# Patient Record
Sex: Male | Born: 1985 | Race: White | Hispanic: No | Marital: Married | Smoking: Never smoker
Health system: Southern US, Community
[De-identification: ages and names within clinical notes are randomized; demographics above are authoritative.]

## PROBLEM LIST (undated history)

## (undated) DIAGNOSIS — T7840XA Allergy, unspecified, initial encounter: Secondary | ICD-10-CM

## (undated) HISTORY — DX: Allergy, unspecified, initial encounter: T78.40XA

---

## 2007-09-28 ENCOUNTER — Emergency Department (HOSPITAL_COMMUNITY): Admission: EM | Admit: 2007-09-28 | Discharge: 2007-09-29 | Payer: Self-pay | Admitting: Emergency Medicine

## 2008-04-10 ENCOUNTER — Emergency Department (HOSPITAL_COMMUNITY): Admission: EM | Admit: 2008-04-10 | Discharge: 2008-04-11 | Payer: Self-pay | Admitting: Emergency Medicine

## 2011-05-21 LAB — CBC
MCHC: 34.3
Platelets: 204
RDW: 14.7

## 2011-05-21 LAB — RAPID URINE DRUG SCREEN, HOSP PERFORMED
Amphetamines: NOT DETECTED
Benzodiazepines: NOT DETECTED
Cocaine: NOT DETECTED
Tetrahydrocannabinol: POSITIVE — AB

## 2011-05-21 LAB — BASIC METABOLIC PANEL
BUN: 9
Calcium: 9.2
Creatinine, Ser: 0.98
GFR calc non Af Amer: >60
Potassium: 3.2 — ABNORMAL LOW

## 2011-05-21 LAB — DIFFERENTIAL
Basophils Absolute: 0
Basophils Relative: 0
Lymphocytes Relative: 15
Neutro Abs: 13.5 — ABNORMAL HIGH

## 2011-05-21 LAB — URINALYSIS, ROUTINE W REFLEX MICROSCOPIC
Ketones, ur: NEGATIVE
Leukocytes, UA: NEGATIVE
Nitrite: NEGATIVE
Urobilinogen, UA: 0.2
pH: 6

## 2011-05-21 LAB — URINE MICROSCOPIC-ADD ON

## 2011-05-29 LAB — URINALYSIS, ROUTINE W REFLEX MICROSCOPIC
Glucose, UA: NEGATIVE
Leukocytes, UA: NEGATIVE
Nitrite: NEGATIVE
Specific Gravity, Urine: 1.037 — ABNORMAL HIGH
pH: 6

## 2011-05-29 LAB — BASIC METABOLIC PANEL
Chloride: 108
Creatinine, Ser: 1.17
GFR calc non Af Amer: >60
Potassium: 3 — ABNORMAL LOW

## 2011-05-29 LAB — RAPID URINE DRUG SCREEN, HOSP PERFORMED
Amphetamines: NOT DETECTED
Opiates: POSITIVE — AB

## 2011-05-29 LAB — ETHANOL

## 2011-05-29 LAB — CBC
HCT: 46
Hemoglobin: 15.5
Platelets: 223
RDW: 13.6
WBC: 9.7

## 2011-05-29 LAB — DIFFERENTIAL
Basophils Absolute: 0.1
Eosinophils Relative: 1
Lymphocytes Relative: 35
Lymphs Abs: 3.4
Neutro Abs: 5.6

## 2011-05-29 LAB — URINE MICROSCOPIC-ADD ON

## 2011-12-10 ENCOUNTER — Encounter: Payer: Self-pay | Admitting: Family Medicine

## 2011-12-10 ENCOUNTER — Ambulatory Visit (INDEPENDENT_AMBULATORY_CARE_PROVIDER_SITE_OTHER): Payer: BC Managed Care – PPO | Admitting: Family Medicine

## 2011-12-10 VITALS — BP 118/76 | HR 92 | Temp 98.6°F | Resp 16 | Ht 68.5 in | Wt 169.8 lb

## 2011-12-10 DIAGNOSIS — F17200 Nicotine dependence, unspecified, uncomplicated: Secondary | ICD-10-CM

## 2011-12-10 DIAGNOSIS — R0683 Snoring: Secondary | ICD-10-CM | POA: Insufficient documentation

## 2011-12-10 DIAGNOSIS — R0989 Other specified symptoms and signs involving the circulatory and respiratory systems: Secondary | ICD-10-CM

## 2011-12-10 DIAGNOSIS — F1911 Other psychoactive substance abuse, in remission: Secondary | ICD-10-CM | POA: Insufficient documentation

## 2011-12-10 MED ORDER — VARENICLINE TARTRATE 0.5 MG PO TABS: 0.5000 mg | ORAL_TABLET | Freq: Two times a day (BID) | ORAL | Status: AC

## 2011-12-10 MED ORDER — VARENICLINE TARTRATE 1 MG PO TABS: 1.0000 mg | ORAL_TABLET | Freq: Two times a day (BID) | ORAL | Status: AC

## 2011-12-10 NOTE — Progress Notes (Signed)
  Subjective:    Patient ID: Randy Barrett, male    DOB: August 17, 1986, 26 y.o.   MRN: [id number]  HPI  This 26 y.o. Cauc male is a smoker and had successful cessation with Chantix for 8 months   in 2011. He requests this medication again as he feels he can quit for good this time. He also snores  and is concerned that he may have sleep apnea. His father has this disorder and uses CPAP. The pt  has occasional fatigue but denies daytime sleepiness. His girlfriend has not reported apneic episodes   to the pt. He works full-time and exercises daily. He is in recovery and does not want to use any type of  hypnotics or other sleep aids. He reports some nights where he sleeps well but usually awakens   several times.     Review of Systems  Constitutional: Negative.   HENT:       Minor allergic symptoms but no daily medication required  Eyes: Negative.   Respiratory: Negative.   Cardiovascular: Negative.   Psychiatric/Behavioral: Positive for sleep disturbance. Negative for behavioral problems, confusion, dysphoric mood and agitation. The patient is not nervous/anxious and is not hyperactive.   All other systems reviewed and are negative.       Objective:   Physical Exam  Vitals reviewed. Constitutional: He is oriented to person, place, and time. He appears well-developed and well-nourished. No distress.  HENT:  Head: Normocephalic and atraumatic.  Right Ear: External ear normal.  Left Ear: External ear normal.  Nose: Nose normal.  Mouth/Throat: Oropharynx is clear and moist.  Eyes: Conjunctivae and EOM are normal. No scleral icterus.  Neck: Normal range of motion. Neck supple. No thyromegaly present.  Cardiovascular: Normal rate and regular rhythm.   Pulmonary/Chest: Effort normal. No respiratory distress.  Lymphadenopathy:    He has no cervical adenopathy.  Neurological: He is alert and oriented to person, place, and time. No cranial nerve deficit.  Psychiatric: He has a normal  mood and affect. His behavior is normal. Thought content normal.          Assessment & Plan:   1. Snoring  Nocturnal polysomnography (NPSG) Discussed simple strategies to prevent snoring  2. Tobacco dependence  RX: Chantix starter kit and maintenance kit x 2 months  3. Hx of substance abuse  Endorsed sobriety and healthy living

## 2011-12-10 NOTE — Patient Instructions (Signed)
Sleep Apnea Sleep apnea is a common disorder. The main problem of this disorder is excessive daytime sleepiness and compromised quality of life. This may include social and emotional problems. There are two types of sleep apnea.  Obstructive sleep apnea is when breathing stops due to a blocked airway.   Central sleep apnea is a malfunction of the brain's normal signal to breathe.  SYMPTOMS  Restless sleep.   Falling asleep while driving and/or during the day.   Loss of energy.   Irritability.   Mood or behavior changes.   Loud, heavy snoring.   Morning headaches.   Trouble concentrating.   Forgetfulness.   Anxiety or depression.   Decreased interest in sex.  Not all people with sleep apnea have all of these symptoms. However, people who have a few of these symptoms should visit their caregiver for an evaluation. Problems related to untreated sleep apnea include:  High blood pressure (hypertension).   Coronary artery disease.   Impotence.   Cognitive dysfunction.   Memory loss.  TREATMENT  For mild cases, treatment may include avoiding sleeping on one's back.   For people with nasal congestion, a decongestant may be prescribed.   Patients with obstructive and central apnea should avoid depressants. This includes alcohol, sedatives and narcotics. Weight loss and diet control are encouraged for overweight patients.   Many serious cases of obstructive sleep apnea can be relieved by a treatment called nasal continuous positive airway pressure (nasal CPAP). Nasal CPAP uses a mask-like device and pump that work together to keep the airway open. The pump delivers air pressure during each breath.   Surgery may help some patients by stopping or reducing the narrowing of the airway due to anatomical defects.  PROGNOSIS  Removing the obstruction usually reverses hypertension and cardiac problems. Untreated, sleep apnea sufferers have a tendency to fall asleep during the day.  This is can result in serious accident or loss of ones job. RESEARCH Sleep apnea is currently one of the most active areas of sleep research.  Document Released: 08/07/2002 Document Revised: 08/06/2011 Document Reviewed: 12/03/2005 [employer] Patient Information 2012 [employer]. 

## 2017-11-10 ENCOUNTER — Ambulatory Visit: Payer: No Typology Code available for payment source | Admitting: Family Medicine

## 2017-11-10 ENCOUNTER — Encounter: Payer: Self-pay | Admitting: Family Medicine

## 2017-11-10 VITALS — BP 130/84 | HR 80 | Temp 98.5°F | Resp 12 | Ht 68.5 in | Wt 183.5 lb

## 2017-11-10 DIAGNOSIS — R0683 Snoring: Secondary | ICD-10-CM

## 2017-11-10 DIAGNOSIS — J309 Allergic rhinitis, unspecified: Secondary | ICD-10-CM | POA: Diagnosis not present

## 2017-11-10 MED ORDER — FLUTICASONE PROPIONATE 50 MCG/ACT NA SUSP: 2.0000 | Freq: Every day | NASAL | 6 refills | Status: DC

## 2017-11-10 NOTE — Patient Instructions (Signed)
EPIC  down. 

## 2017-11-10 NOTE — Progress Notes (Signed)
HPI:   [name] is a 32 y.o. male, who is here today to establish care.  Former PCP: Dr Via Last preventive routine visit: 10/2016  Chronic medical problems: Allergic rhinitis, otherwise healthy. Past Hx of illicit drug use, last used 9-10 years ago.   Concerns today: He would like to have a sleep study. Reporting Hx of snoring disorder for years but it seems to be worse for the past 1-2 months.  In the past snoring was worse when lying on his back and better if he slept on his side or over his abdomen.  For the past couple of months snoring is "bad" regardless of position. According to patient, his wife has been complaining because his snoring is interfering with her sleep.  He denies morning headaches.  He has some fatigue,he feels like he should have more energy but attributes fatigue to his physical job and intense exercise.  He has used nasal strips before and snoring seemed to improve.  Allergic rhinitis, he is currently on Allegra 180 mg daily. He still has intermittent nasal congestion and rhinorrhea, depending off with the changes.  Review of Systems  Constitutional: Positive for fatigue. Negative for activity change, appetite change, fever and unexpected weight change.  HENT: Positive for congestion, postnasal drip and rhinorrhea. Negative for dental problem, nosebleeds and sore throat.   Respiratory: Negative for apnea, cough, shortness of breath and wheezing.   Cardiovascular: Negative for chest pain, palpitations and leg swelling.  Gastrointestinal: Negative for abdominal pain, nausea and vomiting.  Endocrine: Negative for cold intolerance and heat intolerance.  Genitourinary: Negative for decreased urine volume and hematuria.  Musculoskeletal: Negative for gait problem and myalgias.  Allergic/Immunologic: Positive for environmental allergies.  Neurological: Negative for syncope, weakness and headaches.      Current Outpatient Medications on  File Prior to Visit  Medication Sig Dispense Refill  . Fexofenadine HCl (ALLEGRA PO) Take by mouth as needed.    . Multiple Vitamin (MULTIVITAMIN) tablet Take 1 tablet by mouth daily.     No current facility-administered medications on file prior to visit.      Past Medical History:  Diagnosis Date  . Allergy    SEASONAL   No Known Allergies  Family History  Problem Relation Age of Onset  . Sleep apnea Father   . Cancer Maternal Grandfather        Lung cancer  . Diabetes Maternal Grandmother   . Diabetes Paternal Grandmother   . Heart attack Paternal Grandmother   . Mental illness Brother        Depression and suicide attempt as a teen  . Drug abuse Brother     Social History   Socioeconomic History  . Marital status: Married    Spouse name: None  . Number of children: None  . Years of education: None  . Highest education level: None  Social Needs  . Financial resource strain: None  . Food insecurity - worry: None  . Food insecurity - inability: None  . Transportation needs - medical: None  . Transportation needs - non-medical: None  Occupational History  . None  Tobacco Use  . Smoking status: Never Smoker  . Smokeless tobacco: Never Used  Substance and Sexual Activity  . Alcohol use: No  . Drug use: No  . Sexual activity: Yes    Partners: Female  Other Topics Concern  . None  Social History Narrative  . None    Vitals:  11/10/17 1528  BP: 130/84  Pulse: 80  Resp: 12  Temp: 98.5 F (36.9 C)  SpO2: 97%    Body mass index is 27.5 kg/m.      Physical Exam  Nursing note and vitals reviewed. Constitutional: He is oriented to person, place, and time. He appears well-developed and well-nourished. No distress.  HENT:  Head: Normocephalic and atraumatic.  Nose: Septal deviation present.  Mouth/Throat: Oropharynx is clear and moist and mucous membranes are normal.  Mildly hypertrophic turbinates.  Eyes: Conjunctivae are normal. Pupils are  equal, round, and reactive to light.  Neck: No tracheal deviation present. No thyroid mass and no thyromegaly present.  Cardiovascular: Normal rate and regular rhythm.  No murmur heard. Respiratory: Effort normal and breath sounds normal. No respiratory distress.  GI: Soft. He exhibits no mass. There is no hepatomegaly. There is no tenderness.  Musculoskeletal: He exhibits no edema.  Lymphadenopathy:    He has no cervical adenopathy.  Neurological: He is alert and oriented to person, place, and time. He has normal strength. Gait normal.  Skin: Skin is warm. No rash noted. No erythema.  Psychiatric: He has a normal mood and affect.  Well groomed, good eye contact.    ASSESSMENT AND PLAN:   [name] was seen today for establish care.   Snoring Explained that the snoring is not always pathologic, it could be aggravated by nasal congestion, weight gain, or position during his sleep. I recommend trying OTC nasal strips and intranasal Flonase at bedtime.  She is going to see his dentist on 3/26, he is planning on asking him about recommendations in regard to dental guard.  She will let me know if he is still interested in having sleep study.  ENT evaluation could also be considered, septal deviation could also be contributing to problem.  Allergic rhinitis This problem could aggravate his snoring. Flonase nasal spray at bedtime recommended. OTC antihistaminic to continue, he is currently on Allegra 180 mg. Nasal irrigation with saline may also help.       Randy Santucci G. [place], MD  [facility]. [place] office.

## 2017-11-10 NOTE — Assessment & Plan Note (Addendum)
Explained that the snoring is not always pathologic, it could be aggravated by nasal congestion, weight gain, or position during his sleep. I recommend trying OTC nasal strips and intranasal Flonase at bedtime.  She is going to see his dentist on 3/26, he is planning on asking him about recommendations in regard to dental guard.  She will let me know if he is still interested in having sleep study.  ENT evaluation could also be considered, septal deviation could also be contributing to problem.

## 2017-11-10 NOTE — Assessment & Plan Note (Signed)
This problem could aggravate his snoring. Flonase nasal spray at bedtime recommended. OTC antihistaminic to continue, he is currently on Allegra 180 mg. Nasal irrigation with saline may also help.

## 2017-11-19 ENCOUNTER — Encounter: Payer: Self-pay | Admitting: Family Medicine

## 2017-12-01 ENCOUNTER — Telehealth: Payer: Self-pay | Admitting: Family Medicine

## 2017-12-01 ENCOUNTER — Other Ambulatory Visit: Payer: Self-pay | Admitting: Family Medicine

## 2017-12-01 MED ORDER — MONTELUKAST SODIUM 10 MG PO TABS: 10.0000 mg | ORAL_TABLET | Freq: Every day | ORAL | 2 refills | Status: DC

## 2017-12-01 NOTE — Telephone Encounter (Signed)
Message sent to Dr. Jordan for review and approval. 

## 2017-12-01 NOTE — Telephone Encounter (Signed)
Copied from CRM [phone]. Topic: Quick Communication - Rx Refill/Question >> Dec 01, 2017  8:18 AM Landry MellowFoltz, Melissa J wrote: Medication: Singulair  Has the patient contacted their pharmacy? No. (Agent: If no, request that the patient contact the pharmacy for the refill.) Preferred Pharmacy (with phone number or street name): [facility] [address]  Agent: Please be advised that RX refills may take up to 3 business days. We ask that you follow-up with your pharmacy. Pt was told to call if Flonase didn't work and she would send in Singulair.

## 2017-12-01 NOTE — Telephone Encounter (Signed)
Pt RX Flonase on 11/10/17 by Dr. Sw[place]Jordan, (snoring) Pt states he was instructed to call if Flonase didn't work and she would send in Singulair.  [facility] [address]

## 2017-12-01 NOTE — Telephone Encounter (Signed)
Rx for Singulair 10 mg sent to his pharmacy. Continue Flonase nasal spray, Allegra 180 mg (he could change to Zyrtec 10 mg), and saline nasal irrigations as needed. Thanks, BJ

## 2017-12-01 NOTE — Telephone Encounter (Signed)
Patient informed that Rx was sent to pharmacy. Patient verbalized understanding.

## 2017-12-01 NOTE — Telephone Encounter (Signed)
Copied from CRM [phone]. Topic: Referral - Request >> Dec 01, 2017  8:16 AM Landry MellowFoltz, Melissa J wrote: Reason for CRM: pt would like to be referred for a sleep study - please call [phone]

## 2017-12-03 ENCOUNTER — Other Ambulatory Visit: Payer: Self-pay | Admitting: Family Medicine

## 2017-12-03 DIAGNOSIS — R0683 Snoring: Secondary | ICD-10-CM

## 2017-12-03 NOTE — Telephone Encounter (Signed)
Referral to pulmonologist to discuss sleep study due to loud snoring was placed.  Thanks, BJ

## 2017-12-15 ENCOUNTER — Ambulatory Visit (INDEPENDENT_AMBULATORY_CARE_PROVIDER_SITE_OTHER): Payer: No Typology Code available for payment source | Admitting: Pulmonary Disease

## 2017-12-15 ENCOUNTER — Encounter: Payer: Self-pay | Admitting: Pulmonary Disease

## 2017-12-15 DIAGNOSIS — G4733 Obstructive sleep apnea (adult) (pediatric): Secondary | ICD-10-CM

## 2017-12-15 NOTE — Progress Notes (Signed)
Subjective:    Patient ID: Randy Barrett, male    DOB: 1986-01-08, 32 y.o.   MRN: [id number]  HPI  32 year old presents for evaluation of sleep disordered breathing. Loud snoring has been noted by his wife especially in his back.  She has not witnessed apneas.  He denies excessive daytime somnolence or fatigue.  His snoring has been ongoing for 4 years but has been worse during allergy season this year.  He uses Allegra every year but this time he is also had to use nasal Flonase and Singulair.  Epworth sleepiness score is 6. Bedtime is between 9 and 11 PM, sleep latency is about 20 minutes, he sleeps on his back with one pillow, reports 1-2 nocturnal awakenings, denies nocturia and is out of bed by 6 AM feeling rested without dryness of mouth or headaches. He has gained about 10 pounds in the last 2 years most of which is muscle weight. He works as a Art therapist and drives long distances and does not have any difficulty driving.  There is no history suggestive of cataplexy, sleep paralysis or parasomnias   Past Medical History:  Diagnosis Date  . Allergy    SEASONAL   History reviewed. No pertinent surgical history.  No Known Allergies   Social History   Socioeconomic History  . Marital status: Married    Spouse name: Not on file  . Number of children: Not on file  . Years of education: Not on file  . Highest education level: Not on file  Occupational History  . Not on file  Social Needs  . Financial resource strain: Not on file  . Food insecurity:    Worry: Not on file    Inability: Not on file  . Transportation needs:    Medical: Not on file    Non-medical: Not on file  Tobacco Use  . Smoking status: Never Smoker  . Smokeless tobacco: Never Used  Substance and Sexual Activity  . Alcohol use: No  . Drug use: No  . Sexual activity: Yes    Partners: Female  Lifestyle  . Physical activity:    Days per week: Not on file    Minutes per session: Not  on file  . Stress: Not on file  Relationships  . Social connections:    Talks on phone: Not on file    Gets together: Not on file    Attends religious service: Not on file    Active member of club or organization: Not on file    Attends meetings of clubs or organizations: Not on file    Relationship status: Not on file  . Intimate partner violence:    Fear of current or ex partner: Not on file    Emotionally abused: Not on file    Physically abused: Not on file    Forced sexual activity: Not on file  Other Topics Concern  . Not on file  Social History Narrative  . Not on file     Family History  Problem Relation Age of Onset  . Sleep apnea Father   . Cancer Maternal Grandfather        Lung cancer  . Diabetes Maternal Grandmother   . Diabetes Paternal Grandmother   . Heart attack Paternal Grandmother   . Mental illness Brother        Depression and suicide attempt as a teen  . Drug abuse Brother      Review of Systems  Constitutional: Negative for  fever and unexpected weight change.  HENT: Positive for congestion and sinus pressure. Negative for dental problem, ear pain, nosebleeds, postnasal drip, rhinorrhea, sneezing, sore throat and trouble swallowing.   Eyes: Negative for redness and itching.  Respiratory: Negative for cough, chest tightness, shortness of breath and wheezing.   Cardiovascular: Negative for palpitations and leg swelling.  Gastrointestinal: Negative for nausea and vomiting.  Genitourinary: Negative for dysuria.  Musculoskeletal: Negative for joint swelling.  Skin: Negative for rash.  Allergic/Immunologic: Negative.  Negative for environmental allergies, food allergies and immunocompromised state.  Neurological: Negative for headaches.  Hematological: Does not bruise/bleed easily.  Psychiatric/Behavioral: Negative for dysphoric mood. The patient is not nervous/anxious.        Objective:   Physical Exam  Gen. Pleasant, muscular, in no  distress ENT - class 2 airway, no post nasal drip Neck: No JVD, no thyromegaly, no carotid bruits Lungs: no use of accessory muscles, no dullness to percussion, decreased without rales or rhonchi  Cardiovascular: Rhythm regular, heart sounds  normal, no murmurs or gallops, no peripheral edema Musculoskeletal: No deformities, no cyanosis or clubbing , no tremors       Assessment & Plan:

## 2017-12-15 NOTE — Assessment & Plan Note (Signed)
Schedule home sleep study. We discussed treatment options for simple snoring and for obstructive sleep apnea  Given  narrow pharyngeal exam & loud snoring, obstructive sleep apnea is very likely & an overnight polysomnogram will be scheduled as a home study. The pathophysiology of obstructive sleep apnea , it's cardiovascular consequences & modes of treatment including CPAP were discused with the patient in detail & they evidenced understanding.

## 2017-12-15 NOTE — Patient Instructions (Signed)
Schedule home sleep study. We discussed treatment options for simple snoring and for obstructive sleep apnea

## 2018-01-04 DIAGNOSIS — G4730 Sleep apnea, unspecified: Secondary | ICD-10-CM | POA: Diagnosis not present

## 2018-01-06 ENCOUNTER — Other Ambulatory Visit: Payer: Self-pay | Admitting: *Deleted

## 2018-01-06 DIAGNOSIS — G4733 Obstructive sleep apnea (adult) (pediatric): Secondary | ICD-10-CM

## 2018-01-06 DIAGNOSIS — G4710 Hypersomnia, unspecified: Secondary | ICD-10-CM | POA: Diagnosis not present

## 2018-01-07 ENCOUNTER — Telehealth: Payer: Self-pay | Admitting: Pulmonary Disease

## 2018-01-07 NOTE — Telephone Encounter (Signed)
Spoke with patient. He is aware of results. He stated that RA mentioned something about a nasal device for snoring?   RA, do you remember this? Please advise. Thanks!

## 2018-01-07 NOTE — Telephone Encounter (Signed)
Per RA, HST did not show OSA. No further treatment needed.

## 2018-01-10 NOTE — Telephone Encounter (Signed)
Called and spoke with patient regarding Provent Rx per RA recommendations. Pt is wanting to think on this, and speak with his dentist to see if he recommends anything in addition. Pt advised he will call back at a later date. Nothing further needed at this time.

## 2018-01-10 NOTE — Telephone Encounter (Signed)
PROVENT - have him check this out & we can send Rx if he is interested

## 2018-03-14 ENCOUNTER — Telehealth: Payer: Self-pay | Admitting: Pulmonary Disease

## 2018-03-14 NOTE — Telephone Encounter (Signed)
Patient returning call - he can be reached at [phone]

## 2018-03-14 NOTE — Telephone Encounter (Signed)
Pt's sleep study has been faxed to [facility].  Called and spoke with Alcario Droughtrica letting her know this had been done. Erica expressed understanding. Nothing further needed.

## 2018-03-14 NOTE — Telephone Encounter (Signed)
Called patient unable to reach left message to give us a call back.

## 2018-03-14 NOTE — Telephone Encounter (Signed)
Called patient, unable to reach left message to give us a call back. 

## 2018-03-14 NOTE — Telephone Encounter (Signed)
Pt is returning call. CB is [phone].

## 2018-03-14 NOTE — Telephone Encounter (Signed)
Called patient, unable to reach left message to give us a call back. Per protocol will close encounter.  

## 2018-03-20 ENCOUNTER — Other Ambulatory Visit: Payer: Self-pay | Admitting: Family Medicine

## 2018-06-03 ENCOUNTER — Ambulatory Visit (INDEPENDENT_AMBULATORY_CARE_PROVIDER_SITE_OTHER): Payer: No Typology Code available for payment source | Admitting: Urology

## 2018-06-03 DIAGNOSIS — I861 Scrotal varices: Secondary | ICD-10-CM | POA: Diagnosis not present

## 2018-06-03 DIAGNOSIS — N4611 Organic oligospermia: Secondary | ICD-10-CM

## 2018-06-09 ENCOUNTER — Other Ambulatory Visit: Payer: Self-pay | Admitting: Urology

## 2018-06-09 DIAGNOSIS — N4611 Organic oligospermia: Secondary | ICD-10-CM

## 2018-06-16 ENCOUNTER — Other Ambulatory Visit: Payer: Self-pay | Admitting: Urology

## 2018-06-16 DIAGNOSIS — N4611 Organic oligospermia: Secondary | ICD-10-CM

## 2018-06-16 DIAGNOSIS — I861 Scrotal varices: Secondary | ICD-10-CM

## 2018-06-21 ENCOUNTER — Ambulatory Visit (HOSPITAL_COMMUNITY)
Admission: RE | Admit: 2018-06-21 | Discharge: 2018-06-21 | Disposition: A | Discharge status: Home / Self Care | Payer: No Typology Code available for payment source | Source: Ambulatory Visit | Attending: Urology | Admitting: Urology

## 2018-06-21 DIAGNOSIS — I861 Scrotal varices: Secondary | ICD-10-CM | POA: Insufficient documentation

## 2018-06-21 DIAGNOSIS — N4611 Organic oligospermia: Secondary | ICD-10-CM | POA: Diagnosis present

## 2018-08-22 ENCOUNTER — Encounter: Payer: Self-pay | Admitting: Family Medicine

## 2018-08-22 ENCOUNTER — Ambulatory Visit: Payer: No Typology Code available for payment source | Admitting: Family Medicine

## 2018-08-22 VITALS — BP 118/70 | HR 66 | Temp 98.0°F | Resp 12 | Ht 69.0 in | Wt 175.1 lb

## 2018-08-22 DIAGNOSIS — N4611 Organic oligospermia: Secondary | ICD-10-CM | POA: Diagnosis not present

## 2018-08-22 LAB — TESTOSTERONE: Testosterone: 356.82 ng/dL (ref 300.00–890.00)

## 2018-08-22 NOTE — Progress Notes (Signed)
ACUTE VISIT   HPI:  Chief Complaint  Patient presents with  . Discuss labs for referral from Dr. Annabell HowellsWrenn    Randy Barrett is a 32 y.o. male, who is here today requesting lab work done. For a couple years he and his wife have tried to conceive but have been unsuccessful. He does not have children.  He is following with urologist and according to pt,he was told to have labs done in a local laboratory but he has been able to arrange appt.  Negative for headache, visual changes, nipple discharge,decreased libido, or ED. Denies fatigue or depressed mood.     Review of Systems  Constitutional: Negative for fatigue and unexpected weight change.  Endocrine: Negative for cold intolerance and heat intolerance.  Genitourinary: Negative for decreased urine volume, dysuria, hematuria and testicular pain.  Musculoskeletal: Negative for arthralgias and myalgias.  Neurological: Negative for weakness and headaches.  Psychiatric/Behavioral: Negative for sleep disturbance. The patient is not nervous/anxious.       Current Outpatient Medications on File Prior to Visit  Medication Sig Dispense Refill  . Fexofenadine HCl (ALLEGRA PO) Take by mouth as needed.    . fluticasone (FLONASE) 50 MCG/ACT nasal spray Place 2 sprays into both nostrils at bedtime. 16 g 6  . montelukast (SINGULAIR) 10 MG tablet TAKE 1 TABLET BY MOUTH EVERYDAY AT BEDTIME 30 tablet 2  . Multiple Vitamin (MULTIVITAMIN) tablet Take 1 tablet by mouth daily.     No current facility-administered medications on file prior to visit.      Past Medical History:  Diagnosis Date  . Allergy    SEASONAL   No Known Allergies  Social History   Socioeconomic History  . Marital status: Married    Spouse name: Not on file  . Number of children: Not on file  . Years of education: Not on file  . Highest education level: Not on file  Occupational History  . Not on file  Social Needs  . Financial resource  strain: Not on file  . Food insecurity:    Worry: Not on file    Inability: Not on file  . Transportation needs:    Medical: Not on file    Non-medical: Not on file  Tobacco Use  . Smoking status: Never Smoker  . Smokeless tobacco: Never Used  Substance and Sexual Activity  . Alcohol use: No  . Drug use: No  . Sexual activity: Yes    Partners: Female  Lifestyle  . Physical activity:    Days per week: Not on file    Minutes per session: Not on file  . Stress: Not on file  Relationships  . Social connections:    Talks on phone: Not on file    Gets together: Not on file    Attends religious service: Not on file    Active member of club or organization: Not on file    Attends meetings of clubs or organizations: Not on file    Relationship status: Not on file  Other Topics Concern  . Not on file  Social History Narrative  . Not on file    Vitals:   08/22/18 0833  BP: 118/70  Pulse: 66  Resp: 12  Temp: 98 F (36.7 C)  SpO2: 98%   Body mass index is 25.86 kg/m.   Physical Exam  Nursing note and vitals reviewed. Constitutional: He is oriented to person, place, and time. He appears well-developed and well-nourished. No distress.  HENT:  Head: Normocephalic and atraumatic.  Mouth/Throat: Oropharynx is clear and moist and mucous membranes are normal.  Eyes: Conjunctivae are normal.  Neck: No tracheal deviation present. No thyromegaly present.  Cardiovascular: Normal rate and regular rhythm.  No murmur heard. Respiratory: Effort normal and breath sounds normal. No respiratory distress.  Musculoskeletal:        General: No edema.  Lymphadenopathy:    He has no cervical adenopathy.  Neurological: He is alert and oriented to person, place, and time. He has normal strength. Gait normal.  Skin: Skin is warm. No erythema.  Psychiatric: He has a normal mood and affect.  Well groomed, good eye contact.      ASSESSMENT AND PLAN:  Randy Barrett was seen today for discuss  labs for referral from dr. Annabell Howellswrenn.  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 He has already had some blood work done. Dr Annabell HowellsWrenn is requesting above labs, sll ordered today. Results will be faxed to Dr Annabell HowellsWrenn.      [name] G. [place], MD  [facility]. [place] office.

## 2018-08-22 NOTE — Patient Instructions (Signed)
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 If a new problem present, please set up appointment sooner than planned today.

## 2018-08-23 LAB — FSH/LH
FSH: 2.7 m[IU]/mL (ref 1.6–8.0)
LH: 3.8 m[IU]/mL (ref 1.5–9.3)

## 2018-09-16 ENCOUNTER — Ambulatory Visit (INDEPENDENT_AMBULATORY_CARE_PROVIDER_SITE_OTHER): Payer: No Typology Code available for payment source | Admitting: Urology

## 2018-09-16 DIAGNOSIS — I861 Scrotal varices: Secondary | ICD-10-CM

## 2018-09-16 DIAGNOSIS — N4611 Organic oligospermia: Secondary | ICD-10-CM | POA: Diagnosis not present

## 2018-10-04 ENCOUNTER — Ambulatory Visit: Payer: No Typology Code available for payment source | Admitting: Urology

## 2018-10-04 DIAGNOSIS — I861 Scrotal varices: Secondary | ICD-10-CM | POA: Diagnosis not present

## 2018-10-04 DIAGNOSIS — N4611 Organic oligospermia: Secondary | ICD-10-CM | POA: Diagnosis not present

## 2018-10-05 ENCOUNTER — Other Ambulatory Visit: Payer: Self-pay | Admitting: Urology

## 2018-10-27 ENCOUNTER — Other Ambulatory Visit (HOSPITAL_COMMUNITY): Payer: No Typology Code available for payment source

## 2018-11-03 ENCOUNTER — Ambulatory Visit: Admit: 2018-11-03 | Payer: No Typology Code available for payment source | Admitting: Urology

## 2018-11-03 SURGERY — EXCISION, VARICOCELE
Anesthesia: General | Laterality: Left

## 2018-11-30 ENCOUNTER — Encounter: Payer: Self-pay | Admitting: Family Medicine

## 2018-12-01 ENCOUNTER — Other Ambulatory Visit: Payer: Self-pay | Admitting: *Deleted

## 2018-12-01 DIAGNOSIS — J309 Allergic rhinitis, unspecified: Secondary | ICD-10-CM

## 2018-12-01 MED ORDER — FLUTICASONE PROPIONATE 50 MCG/ACT NA SUSP: 2.0000 | Freq: Every day | NASAL | 6 refills | Status: DC

## 2018-12-01 MED ORDER — MONTELUKAST SODIUM 10 MG PO TABS: ORAL_TABLET | ORAL | 2 refills | Status: DC

## 2018-12-05 ENCOUNTER — Other Ambulatory Visit: Payer: Self-pay | Admitting: Family Medicine

## 2018-12-05 DIAGNOSIS — J309 Allergic rhinitis, unspecified: Secondary | ICD-10-CM

## 2018-12-07 ENCOUNTER — Other Ambulatory Visit: Payer: Self-pay | Admitting: *Deleted

## 2018-12-07 MED ORDER — MONTELUKAST SODIUM 10 MG PO TABS: ORAL_TABLET | ORAL | 2 refills | Status: DC

## 2019-03-29 ENCOUNTER — Other Ambulatory Visit: Payer: Self-pay | Admitting: Family Medicine

## 2019-04-04 IMAGING — US US SCROTUM W/ DOPPLER COMPLETE
1 series · 14 of 25 positions shown · non-contrast
Comparison: None

CLINICAL DATA: Oligospermia, varicocele

EXAM:
SCROTAL ULTRASOUND
DOPPLER ULTRASOUND OF THE TESTICLES
TECHNIQUE: Complete ultrasound examination of the testicles, epididymis, and
other scrotal structures was performed. Color and spectral Doppler
ultrasound were also utilized to evaluate blood flow to the
testicles.

[Series 1: us scrotum w/ doppler complete · 14 of 58 slices shown]
[im 1/58]
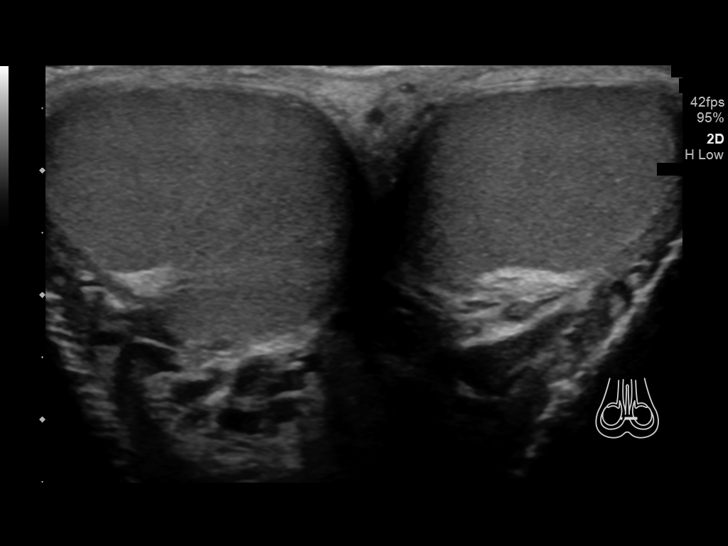
[im 5/58]
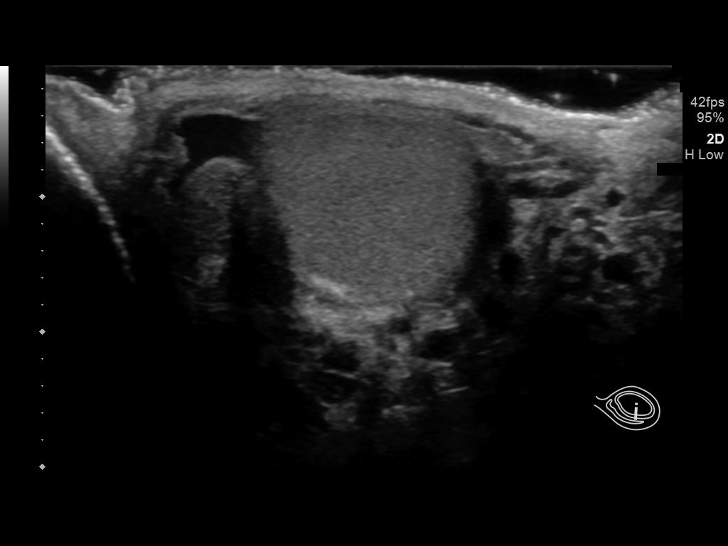
[im 10/58]
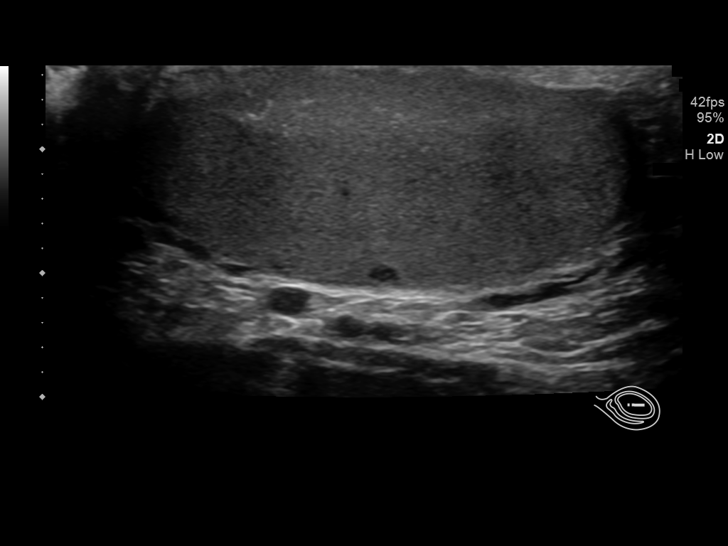
[im 15/58]
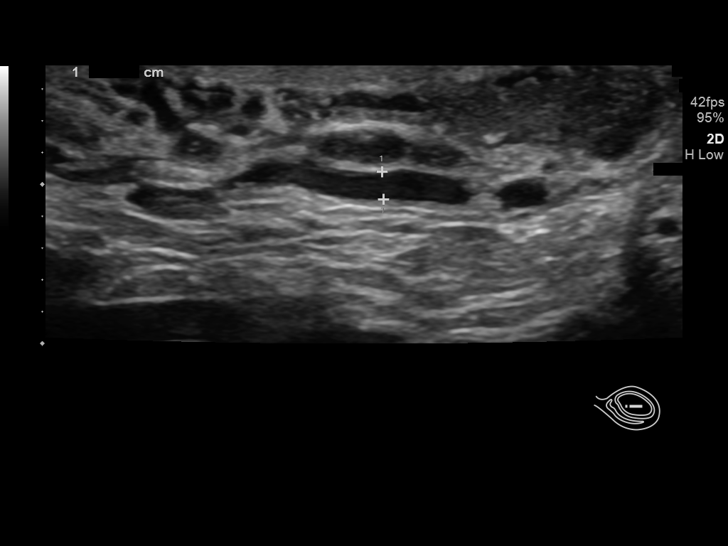
[im 20/58]
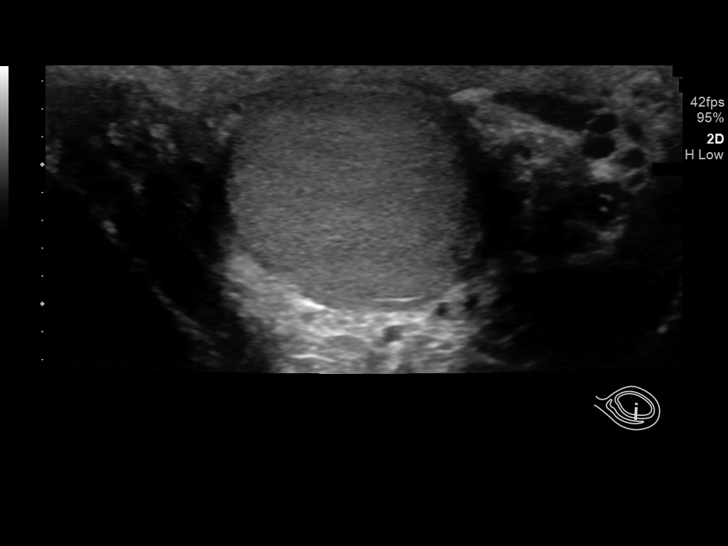
[im 22/58]
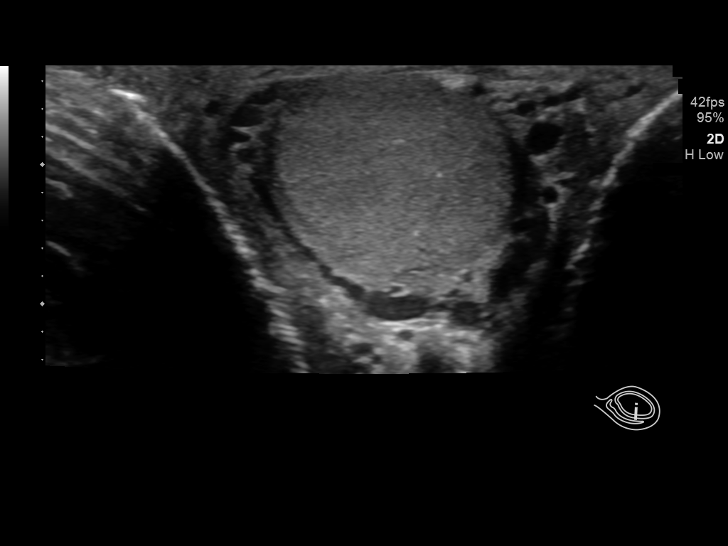
[im 27/58]
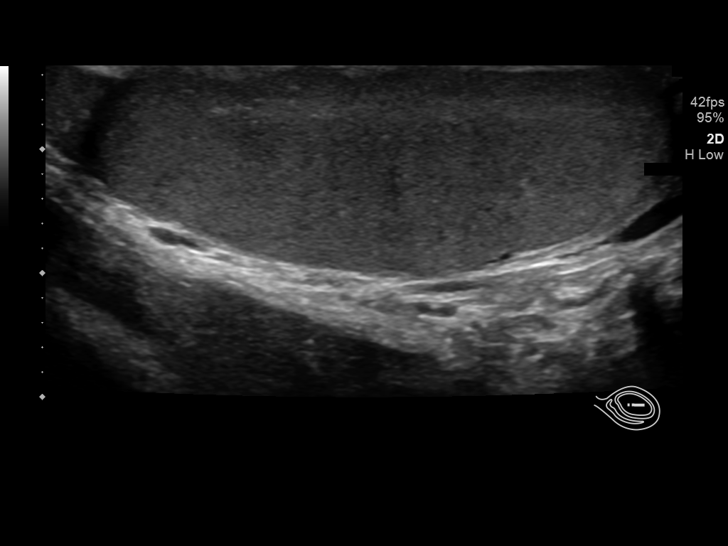
[im 31/58]
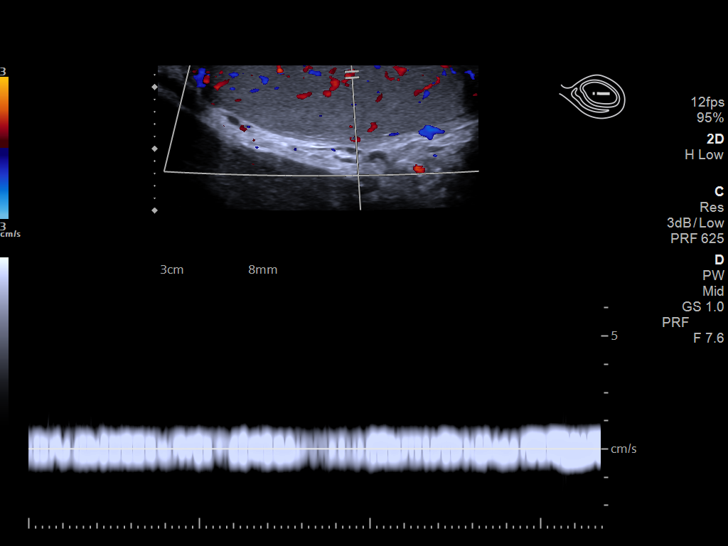
[im 36/58]
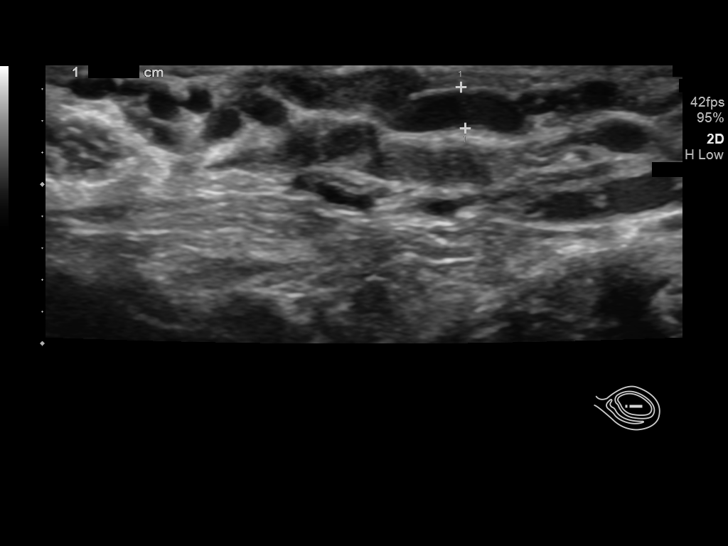
[im 39/58]
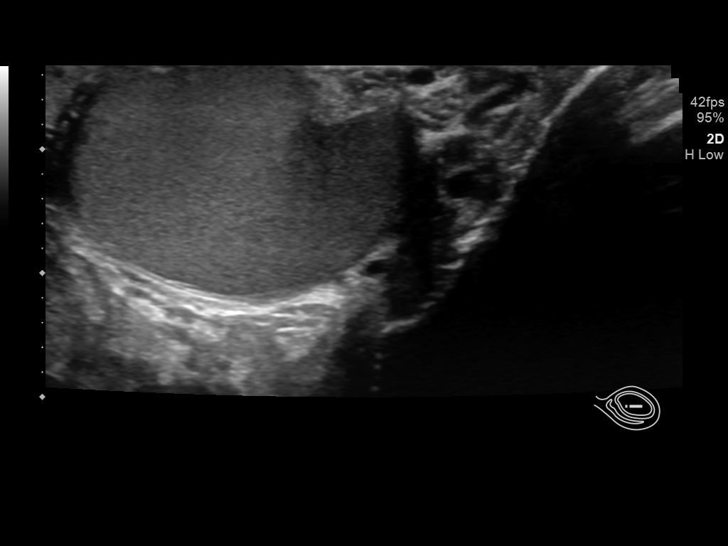
[im 43/58]
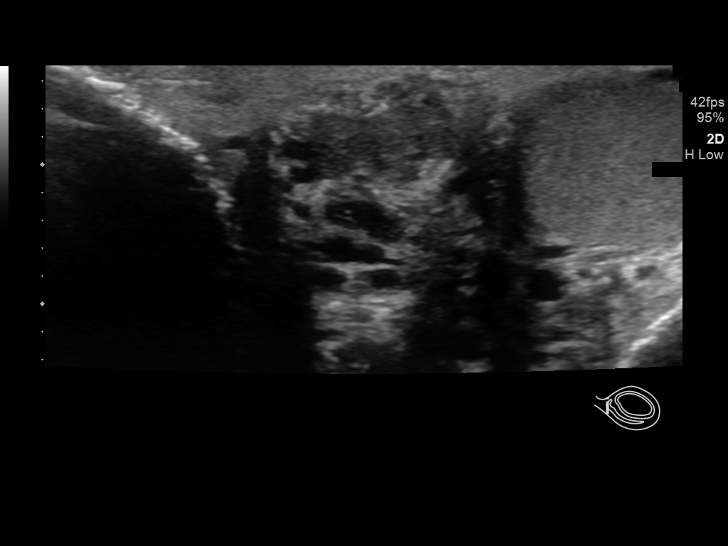
[im 48/58]
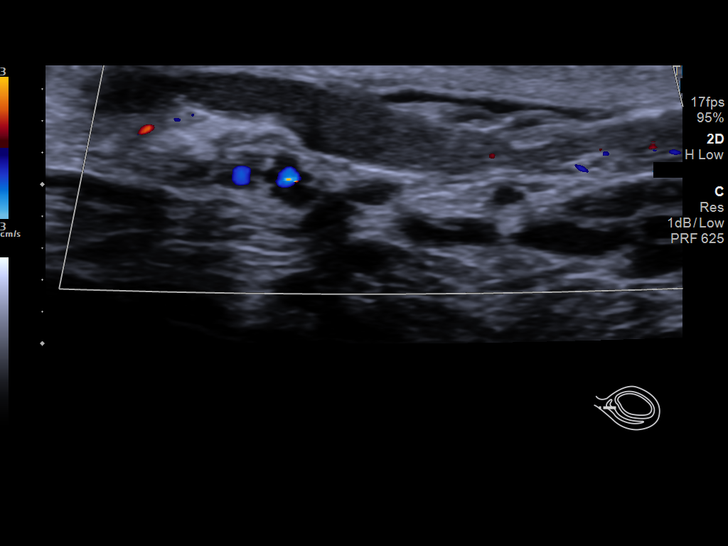
[im 53/58]
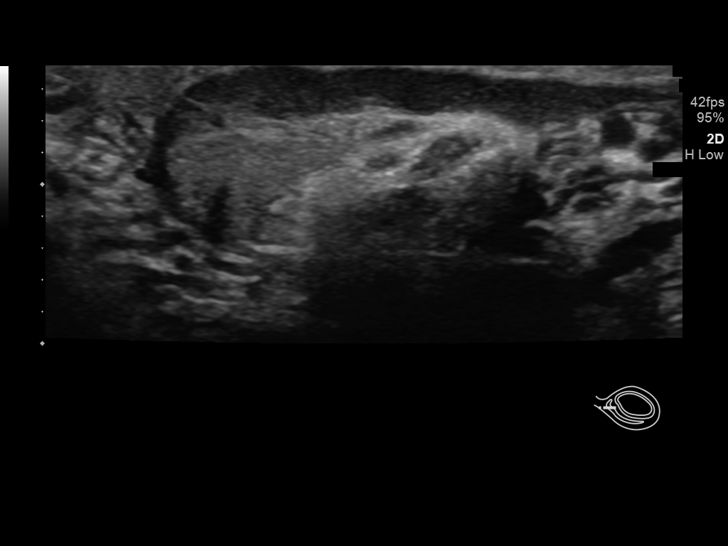
[im 58/58]
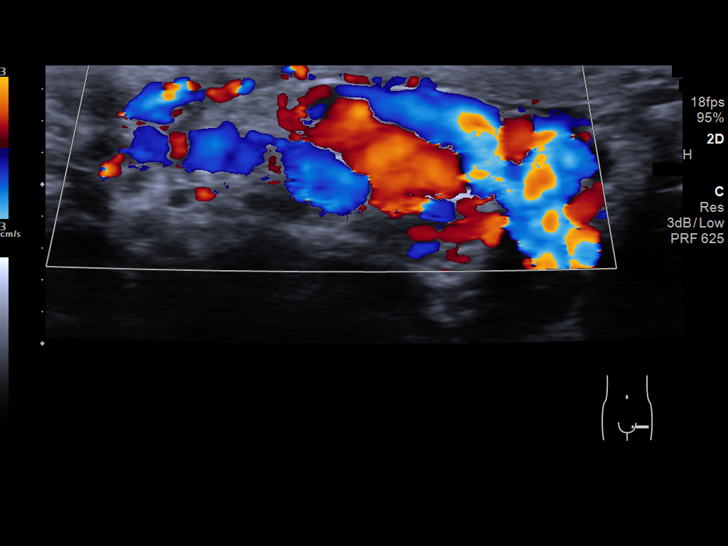

[14 of 25 positions shown; findings below may reference images not displayed]

FINDINGS: Right testicle

Measurements: 4.5 x 2.3 x 2.6 cm. Normal morphology without mass or
calcification. Internal blood flow present on color Doppler imaging.

Left testicle

Measurements: 4.8 x 2.0 x 2.6 cm. Normal morphology without mass or
calcification. Internal blood flow present on color Doppler imaging.

Right epididymis:  Normal in size and appearance.

Left epididymis:  Normal in size and appearance.

Hydrocele:  Absent bilaterally

Varicocele: Probable LEFT varicocele with multiple dilated veins in
the LEFT spermatic cord. No RIGHT varicocele.

Pulsed Doppler interrogation of both testes demonstrates normal low
resistance arterial and venous waveforms bilaterally.
IMPRESSION: Probable LEFT varicocele.

Otherwise negative exam.

## 2019-04-12 ENCOUNTER — Other Ambulatory Visit: Payer: Self-pay | Admitting: Family Medicine

## 2019-04-12 DIAGNOSIS — J309 Allergic rhinitis, unspecified: Secondary | ICD-10-CM

## 2019-07-10 ENCOUNTER — Other Ambulatory Visit: Payer: Self-pay | Admitting: Family Medicine

## 2019-08-15 ENCOUNTER — Other Ambulatory Visit: Payer: Self-pay | Admitting: Family Medicine

## 2019-12-26 ENCOUNTER — Encounter: Payer: Self-pay | Admitting: Family Medicine

## 2019-12-26 ENCOUNTER — Telehealth (INDEPENDENT_AMBULATORY_CARE_PROVIDER_SITE_OTHER): Payer: No Typology Code available for payment source | Admitting: Family Medicine

## 2019-12-26 DIAGNOSIS — J309 Allergic rhinitis, unspecified: Secondary | ICD-10-CM

## 2019-12-26 MED ORDER — FLUTICASONE PROPIONATE 50 MCG/ACT NA SUSP: NASAL | 6 refills | Status: DC

## 2019-12-26 MED ORDER — MONTELUKAST SODIUM 10 MG PO TABS: 10.0000 mg | ORAL_TABLET | Freq: Every day | ORAL | 3 refills | Status: DC

## 2019-12-26 NOTE — Progress Notes (Addendum)
Virtual Visit via Video Note  I connected with Randy Barrett on 12/26/19 by a video enabled telemedicine application and verified that I am speaking with the correct person using two identifiers.  Location patient: home Location provider:work office Persons participating in the virtual visit: patient, provider  I discussed the limitations of evaluation and management by telemedicine and the availability of in person appointments. The patient expressed understanding and agreed to proceed.  Chief Complaint  Patient presents with  . Medication Refill    refill of allergy medications    HPI: Randy Randy Barrett is a 34 yo male who is being seen today to to follow on allergies. Last seen in 07/2018 because concerns about infertility, treatment was successful, he has a child now. No new problems since his last visit. Currently he is on Flonase nasal spray daily as needed and Singulair 10 mg daily.  At the beginning he was taking medications as needed during spring and fall but for this past year he has been taking a daily and he has daily help with preventing symptoms. No side effect from medications. Negative for fever, chills, sore throat, cough, wheezing, SOB, abdominal pain, N/V, or a skin rash.  No other concerns today.  ROS: See pertinent positives and negatives per HPI.  Past Medical History:  Diagnosis Date  . Allergy    SEASONAL   History reviewed. No pertinent surgical history.  Family History  Problem Relation Age of Onset  . Sleep apnea Father   . Cancer Maternal Grandfather        Lung cancer  . Diabetes Maternal Grandmother   . Diabetes Paternal Grandmother   . Heart attack Paternal Grandmother   . Mental illness Brother        Depression and suicide attempt as a teen  . Drug abuse Brother     Social History   Socioeconomic History  . Marital status: Married    Spouse name: Not on file  . Number of children: Not on file  . Years of education: Not on file  . Highest  education level: Not on file  Occupational History  . Not on file  Tobacco Use  . Smoking status: Never Smoker  . Smokeless tobacco: Never Used  Substance and Sexual Activity  . Alcohol use: No  . Drug use: No  . Sexual activity: Yes    Partners: Female  Other Topics Concern  . Not on file  Social History Narrative  . Not on file   Social Determinants of Health   Financial Resource Strain:   . Difficulty of Paying Living Expenses:   Food Insecurity:   . Worried About Charity fundraiser in the Last Year:   . Arboriculturist in the Last Year:   Transportation Needs:   . Film/video editor (Medical):   Marland Kitchen Lack of Transportation (Non-Medical):   Physical Activity:   . Days of Exercise per Week:   . Minutes of Exercise per Session:   Stress:   . Feeling of Stress :   Social Connections:   . Frequency of Communication with Friends and Family:   . Frequency of Social Gatherings with Friends and Family:   . Attends Religious Services:   . Active Member of Clubs or Organizations:   . Attends Archivist Meetings:   Marland Kitchen Marital Status:   Intimate Partner Violence:   . Fear of Current or Ex-Partner:   . Emotionally Abused:   Marland Kitchen Physically Abused:   .  Sexually Abused:     Current Outpatient Medications:  .  Fexofenadine HCl (ALLEGRA PO), Take by mouth as needed., Disp: , Rfl:  .  fluticasone (FLONASE) 50 MCG/ACT nasal spray, INHALE 2 SPRAYS INTO BOTH NOSTRILS AT BEDTIME, Disp: 16 mL, Rfl: 6 .  montelukast (SINGULAIR) 10 MG tablet, Take 1 tablet (10 mg total) by mouth at bedtime., Disp: 90 tablet, Rfl: 3 .  Multiple Vitamin (MULTIVITAMIN) tablet, Take 1 tablet by mouth daily., Disp: , Rfl:   EXAM:  VITALS per patient if applicable:Ht 5\' 9"  (1.753 m)   BMI 25.86 kg/m   GENERAL: alert, oriented, appears well and in no acute distress  HEENT: atraumatic, conjunctiva clear, no obvious abnormalities on inspection.  NECK: normal movements of the head and  neck  LUNGS: on inspection no signs of respiratory distress, breathing rate appears normal, no obvious gross SOB, gasping or wheezing  CV: no obvious cyanosis  PSYCH/NEURO: pleasant and cooperative, no obvious depression or anxiety, speech and thought processing grossly intact  ASSESSMENT AND PLAN:  Discussed the following assessment and plan:  Allergic rhinitis, unspecified seasonality, unspecified trigger - Plan: montelukast (SINGULAIR) 10 MG tablet, fluticasone (FLONASE) 50 MCG/ACT nasal spray  Problem is well controlled. Continue Singulair 10 mg daily, he could have some breaks in between seasons but it seems to be working better when taking it daily. Flonase nasal spray daily as needed. Intranasal saline irrigations when symptomatic may also help. Follow-up in a year, before if needed.   I discussed the assessment and treatment plan with the patient. Randy Randy Barrett was provided an opportunity to ask questions and all were answered. He agreed with the plan and demonstrated an understanding of the instructions.    Return in about 1 year (around 12/25/2020) for cpe and f/u.    Randy Barrett 12/27/2020, MD

## 2019-12-27 ENCOUNTER — Telehealth: Payer: Self-pay | Admitting: Family Medicine

## 2019-12-27 NOTE — Telephone Encounter (Signed)
Dr. [place] wants a one year follow up and CPE scheduled. LVM to set up

## 2020-12-27 ENCOUNTER — Ambulatory Visit (INDEPENDENT_AMBULATORY_CARE_PROVIDER_SITE_OTHER): Payer: No Typology Code available for payment source | Admitting: Family Medicine

## 2020-12-27 ENCOUNTER — Other Ambulatory Visit: Payer: Self-pay

## 2020-12-27 ENCOUNTER — Encounter: Payer: Self-pay | Admitting: Family Medicine

## 2020-12-27 VITALS — BP 120/70 | HR 65 | Resp 12 | Ht 69.0 in | Wt 179.4 lb

## 2020-12-27 DIAGNOSIS — Z13228 Encounter for screening for other metabolic disorders: Secondary | ICD-10-CM | POA: Diagnosis not present

## 2020-12-27 DIAGNOSIS — Z1159 Encounter for screening for other viral diseases: Secondary | ICD-10-CM | POA: Diagnosis not present

## 2020-12-27 DIAGNOSIS — J309 Allergic rhinitis, unspecified: Secondary | ICD-10-CM

## 2020-12-27 DIAGNOSIS — Z130 Encounter for screening for diseases of the blood and blood-forming organs and certain disorders involving the immune mechanism: Secondary | ICD-10-CM

## 2020-12-27 DIAGNOSIS — Z0000 Encounter for general adult medical examination without abnormal findings: Secondary | ICD-10-CM

## 2020-12-27 DIAGNOSIS — Z13220 Encounter for screening for lipoid disorders: Secondary | ICD-10-CM | POA: Diagnosis not present

## 2020-12-27 DIAGNOSIS — Z1329 Encounter for screening for other suspected endocrine disorder: Secondary | ICD-10-CM

## 2020-12-27 LAB — LIPID PANEL
Cholesterol: 170 mg/dL (ref 0–200)
HDL: 44.8 mg/dL (ref >39.00)
LDL Cholesterol: 111 mg/dL — ABNORMAL HIGH (ref 0–99)
NonHDL: 125.23
Total CHOL/HDL Ratio: 4
Triglycerides: 73 mg/dL (ref 0.0–149.0)
VLDL: 14.6 mg/dL (ref 0.0–40.0)

## 2020-12-27 LAB — BASIC METABOLIC PANEL
BUN: 13 mg/dL (ref 6–23)
CO2: 28 mEq/L (ref 19–32)
Calcium: 9.1 mg/dL (ref 8.4–10.5)
Chloride: 104 mEq/L (ref 96–112)
Creatinine, Ser: 1.29 mg/dL (ref 0.40–1.50)
GFR: 71.42 mL/min (ref >60.00)
Glucose, Bld: 96 mg/dL (ref 70–99)
Potassium: 4.4 mEq/L (ref 3.5–5.1)
Sodium: 138 mEq/L (ref 135–145)

## 2020-12-27 LAB — HEMOGLOBIN A1C: Hgb A1c MFr Bld: 5.4 % (ref 4.6–6.5)

## 2020-12-27 MED ORDER — FLUTICASONE PROPIONATE 50 MCG/ACT NA SUSP: NASAL | 6 refills | Status: AC

## 2020-12-27 MED ORDER — MONTELUKAST SODIUM 10 MG PO TABS: 10.0000 mg | ORAL_TABLET | Freq: Every day | ORAL | 3 refills | Status: DC

## 2020-12-27 NOTE — Progress Notes (Signed)
HPI:  [name] is a 35 y.o.male here today for his routine physical examination.  Last CPE: 10/01/16. He lives with wife and 38 months old daughter.  Regular exercise 3 or more times per week: Wt lifiting 4-5 times per week, some cardio at home, and play soccer in the adult league Sunday Cooks more of the time, he follows a healthful diet.  Chronic medical problems: Allergies and OSA treated with dental devise.  Hx of STD's: Negative.  Immunization History  Administered Date(s) Administered  . Influenza-Unspecified 06/04/2019  . Tdap 09/01/2011   -Hep C screening: Never.  Last colon cancer screening: N/A Last prostate ca screening: N/A  Negative for high alcohol intake. Sleeps about 6-7 hours.  -Concerns and/or follow up today: He needs refills on Flonase nasal spray and Singulair 10 mg.  Medications have helped with allergies. Symptoms worse during Spring.  Review of Systems  Constitutional: Negative for activity change, appetite change, fatigue and fever.  HENT: Negative for nosebleeds, sore throat, trouble swallowing and voice change.   Eyes: Negative for redness and visual disturbance.  Respiratory: Negative for cough, shortness of breath and wheezing.   Cardiovascular: Negative for chest pain, palpitations and leg swelling.  Gastrointestinal: Negative for abdominal pain, blood in stool, nausea and vomiting.  Endocrine: Negative for cold intolerance, heat intolerance, polydipsia, polyphagia and polyuria.  Genitourinary: Negative for decreased urine volume, dysuria, genital sores, hematuria and testicular pain.  Musculoskeletal: Negative for gait problem and myalgias.  Skin: Negative for color change and rash.  Allergic/Immunologic: Positive for environmental allergies.  Neurological: Negative for syncope, weakness and headaches.  Hematological: Negative for adenopathy. Does not bruise/bleed easily.  Psychiatric/Behavioral: Negative for confusion and  sleep disturbance. The patient is not nervous/anxious.    Current Outpatient Medications on File Prior to Visit  Medication Sig Dispense Refill  . Fexofenadine HCl (ALLEGRA PO) Take by mouth as needed.    . Multiple Vitamin (MULTIVITAMIN) tablet Take 1 tablet by mouth daily.     No current facility-administered medications on file prior to visit.   Past Medical History:  Diagnosis Date  . Allergy    SEASONAL   History reviewed. No pertinent surgical history.  No Known Allergies  Family History  Problem Relation Age of Onset  . Sleep apnea Father   . Cancer Maternal Grandfather        Lung cancer  . Diabetes Maternal Grandmother   . Diabetes Paternal Grandmother   . Heart attack Paternal Grandmother   . Mental illness Brother        Depression and suicide attempt as a teen  . Drug abuse Brother     Social History   Socioeconomic History  . Marital status: Married    Spouse name: Not on file  . Number of children: Not on file  . Years of education: Not on file  . Highest education level: Not on file  Occupational History  . Not on file  Tobacco Use  . Smoking status: Never Smoker  . Smokeless tobacco: Never Used  Vaping Use  . Vaping Use: Never used  Substance and Sexual Activity  . Alcohol use: No  . Drug use: No  . Sexual activity: Yes    Partners: Female  Other Topics Concern  . Not on file  Social History Narrative  . Not on file   Social Determinants of Health   Financial Resource Strain: Not on file  Food Insecurity: Not on file  Transportation Needs: Not  on file  Physical Activity: Not on file  Stress: Not on file  Social Connections: Not on file   Vitals:   12/27/20 0702  BP: 120/70  Pulse: 65  Resp: 12  SpO2: 98%   Body mass index is 26.49 kg/m.  Wt Readings from Last 3 Encounters:  12/27/20 179 lb 6 oz (81.4 kg)  08/22/18 175 lb 2 oz (79.4 kg)  12/15/17 184 lb 12.8 oz (83.8 kg)   Physical Exam Vitals and nursing note reviewed.   Constitutional:      General: He is not in acute distress.    Appearance: He is well-developed.  HENT:     Head: Atraumatic.     Right Ear: Tympanic membrane, ear canal and external ear normal.     Left Ear: Tympanic membrane, ear canal and external ear normal.     Mouth/Throat:     Mouth: Mucous membranes are moist.  Eyes:     Extraocular Movements: Extraocular movements intact.     Conjunctiva/sclera: Conjunctivae normal.     Pupils: Pupils are equal, round, and reactive to light.  Neck:     Thyroid: No thyromegaly.     Trachea: No tracheal deviation.  Cardiovascular:     Rate and Rhythm: Normal rate and regular rhythm.     Pulses:          Dorsalis pedis pulses are 2+ on the right side and 2+ on the left side.     Heart sounds: No murmur heard.   Pulmonary:     Effort: Pulmonary effort is normal. No respiratory distress.     Breath sounds: Normal breath sounds.  Chest:  Breasts:     Right: No supraclavicular adenopathy.     Left: No supraclavicular adenopathy.    Abdominal:     Palpations: Abdomen is soft. There is no hepatomegaly or mass.     Tenderness: There is no abdominal tenderness.  Genitourinary:    Comments: No concerns. Musculoskeletal:        General: No tenderness.     Cervical back: Normal range of motion.     Comments: No major deformities appreciated and no signs of synovitis.  Lymphadenopathy:     Cervical: No cervical adenopathy.     Upper Body:     Right upper body: No supraclavicular adenopathy.     Left upper body: No supraclavicular adenopathy.  Skin:    General: Skin is warm.     Findings: No erythema.  Neurological:     Mental Status: He is alert and oriented to person, place, and time.     Cranial Nerves: No cranial nerve deficit.     Sensory: No sensory deficit.     Coordination: Coordination normal.     Gait: Gait normal.     Deep Tendon Reflexes:     Reflex Scores:      Bicep reflexes are 2+ on the right side and 2+ on the left  side.      Patellar reflexes are 2+ on the right side and 2+ on the left side.  ASSESSMENT AND PLAN:  [name] was seen today for annual exam.  Diagnoses and all orders for this visit: Lab Results  Component Value Date   CREATININE 1.29 12/27/2020   BUN 13 12/27/2020   NA 138 12/27/2020   K 4.4 12/27/2020   CL 104 12/27/2020   CO2 28 12/27/2020   Lab Results  Component Value Date   HGBA1C 5.4 12/27/2020   Lab Results  Component Value Date   CHOL 170 12/27/2020   HDL 44.80 12/27/2020   LDLCALC 111 (H) 12/27/2020   TRIG 73.0 12/27/2020   CHOLHDL 4 12/27/2020   Routine general medical examination at a health care facility We discussed the importance of regular physical activity and healthy diet for prevention of chronic illness and/or complications. Preventive guidelines reviewed. Vaccination up to date.  Next CPE in a year.  Encounter for HCV screening test for low risk patient -     Hepatitis C antibody screen  Allergic rhinitis, unspecified seasonality, unspecified trigger Problem is well controlled. No changes in current management. He can take Singulair during times of the year when symptoms are worse.  -     montelukast (SINGULAIR) 10 MG tablet; Take 1 tablet (10 mg total) by mouth at bedtime. -     fluticasone (FLONASE) 50 MCG/ACT nasal spray; INHALE 2 SPRAYS INTO BOTH NOSTRILS AT BEDTIME  Screening for lipoid disorders -     Lipid panel  Screening for endocrine, metabolic and immunity disorder -     Hemoglobin A1c -     Basic metabolic panel   Return in 1 year (on 12/27/2021) for CPE.   Martice Doty G. [place], MD  [facility]. [place] office.  A few things to remember from today's visit:  Routine general medical examination at a health care facility  Encounter for HCV screening test for low risk patient - Plan: Hepatitis C antibody screen  Allergic rhinitis, unspecified seasonality, unspecified trigger - Plan: montelukast (SINGULAIR) 10 MG  tablet, fluticasone (FLONASE) 50 MCG/ACT nasal spray  Screening for lipoid disorders - Plan: Lipid panel  Screening for endocrine, metabolic and immunity disorder - Plan: Basic metabolic panel, Hemoglobin A1c  Please be sure medication list is accurate. If a new problem present, please set up appointment sooner than planned today.   At least 150 minutes of moderate exercise per week, daily brisk walking for 15-30 min is a good exercise option. Healthy diet low in saturated (animal) fats and sweets and consisting of fresh fruits and vegetables, lean meats such as fish and white chicken and whole grains.  - Vaccines:  Tdap vaccine every 10 years.  Shingles vaccine recommended at age 22, could be given after 37 years of age but not sure about insurance coverage.  Pneumonia vaccines: Pneumovax at 65.  -Screening recommendations for low/normal risk males:  Screening for diabetes at age 59 and every 3 years. Earlier screening if cardiovascular risk factors.   Lipid screening at 35 and every 3 years. Screening starts in younger males with cardiovascular risk factors.  Colon cancer screening is now at age 24 but your insurance may not cover until age 68 .screening is recommended age 65.  Prostate cancer screening: some controversy, starts usually at 50: Rectal exam and PSA.  Aortic Abdominal Aneurism once between 79 and 67 years old if ever smoker.  Also recommended:  1. Dental visit- Brush and floss your teeth twice daily; visit your dentist twice a year. 2. Eye doctor- Get an eye exam at least every 2 years. 3. Helmet use- Always wear a helmet when riding a bicycle, motorcycle, rollerblading or skateboarding. 4. Safe sex- If you may be exposed to sexually transmitted infections, use a condom. 5. Seat belts- Seat belts can save your live; always wear one. 6. Smoke/Carbon Monoxide detectors- These detectors need to be installed on the appropriate level of your home. Replace batteries  at least once a year. 7. Skin cancer- When out  in the sun please cover up and use sunscreen 15 SPF or higher. 8. Violence- If anyone is threatening or hurting you, please tell your healthcare provider.  9. Drink alcohol in moderation- Limit alcohol intake to one drink or less per day. Never drink and drive.

## 2020-12-27 NOTE — Patient Instructions (Addendum)
A few things to remember from today's visit:  Routine general medical examination at a health care facility  Encounter for HCV screening test for low risk patient - Plan: Hepatitis C antibody screen  Allergic rhinitis, unspecified seasonality, unspecified trigger - Plan: montelukast (SINGULAIR) 10 MG tablet, fluticasone (FLONASE) 50 MCG/ACT nasal spray  Screening for lipoid disorders - Plan: Lipid panel  Screening for endocrine, metabolic and immunity disorder - Plan: Basic metabolic panel, Hemoglobin A1c  Please be sure medication list is accurate. If a new problem present, please set up appointment sooner than planned today.   At least 150 minutes of moderate exercise per week, daily brisk walking for 15-30 min is a good exercise option. Healthy diet low in saturated (animal) fats and sweets and consisting of fresh fruits and vegetables, lean meats such as fish and white chicken and whole grains.  - Vaccines:  Tdap vaccine every 10 years.  Shingles vaccine recommended at age 37, could be given after 37 years of age but not sure about insurance coverage.  Pneumonia vaccines: Pneumovax at 65.  -Screening recommendations for low/normal risk males:  Screening for diabetes at age 62 and every 3 years. Earlier screening if cardiovascular risk factors.   Lipid screening at 35 and every 3 years. Screening starts in younger males with cardiovascular risk factors.  Colon cancer screening is now at age 39 but your insurance may not cover until age 73 .screening is recommended age 21.  Prostate cancer screening: some controversy, starts usually at 50: Rectal exam and PSA.  Aortic Abdominal Aneurism once between 72 and 28 years old if ever smoker.  Also recommended:  1. Dental visit- Brush and floss your teeth twice daily; visit your dentist twice a year. 2. Eye doctor- Get an eye exam at least every 2 years. 3. Helmet use- Always wear a helmet when riding a bicycle, motorcycle,  rollerblading or skateboarding. 4. Safe sex- If you may be exposed to sexually transmitted infections, use a condom. 5. Seat belts- Seat belts can save your live; always wear one. 6. Smoke/Carbon Monoxide detectors- These detectors need to be installed on the appropriate level of your home. Replace batteries at least once a year. 7. Skin cancer- When out in the sun please cover up and use sunscreen 15 SPF or higher. 8. Violence- If anyone is threatening or hurting you, please tell your healthcare provider.  9. Drink alcohol in moderation- Limit alcohol intake to one drink or less per day. Never drink and drive.

## 2020-12-30 LAB — HEPATITIS C ANTIBODY
Hepatitis C Ab: NONREACTIVE
SIGNAL TO CUT-OFF: 0 (ref <1.00)

## 2022-03-24 ENCOUNTER — Other Ambulatory Visit: Payer: Self-pay | Admitting: Family Medicine

## 2022-03-24 DIAGNOSIS — J309 Allergic rhinitis, unspecified: Secondary | ICD-10-CM

## 2022-05-21 ENCOUNTER — Other Ambulatory Visit: Payer: Self-pay | Admitting: Family Medicine

## 2022-05-21 DIAGNOSIS — J309 Allergic rhinitis, unspecified: Secondary | ICD-10-CM

## 2022-06-13 ENCOUNTER — Other Ambulatory Visit: Payer: Self-pay | Admitting: Family Medicine

## 2022-06-13 DIAGNOSIS — J309 Allergic rhinitis, unspecified: Secondary | ICD-10-CM
